# Patient Record
Sex: Female | Born: 1965 | Race: Black or African American | Hispanic: No | Marital: Single | Smoking: Never smoker
Health system: Southern US, Community
[De-identification: ages and names within clinical notes are randomized; demographics above are authoritative.]

## PROBLEM LIST (undated history)

## (undated) DIAGNOSIS — I10 Essential (primary) hypertension: Secondary | ICD-10-CM

## (undated) HISTORY — PX: ANKLE FRACTURE SURGERY: SHX122

---

## 2018-09-03 ENCOUNTER — Encounter (HOSPITAL_BASED_OUTPATIENT_CLINIC_OR_DEPARTMENT_OTHER): Payer: Self-pay | Admitting: Emergency Medicine

## 2018-09-03 ENCOUNTER — Emergency Department (HOSPITAL_BASED_OUTPATIENT_CLINIC_OR_DEPARTMENT_OTHER)
Admission: EM | Admit: 2018-09-03 | Discharge: 2018-09-03 | Disposition: A | Discharge status: Home / Self Care | Payer: BC Managed Care – PPO | Attending: Emergency Medicine | Admitting: Emergency Medicine

## 2018-09-03 ENCOUNTER — Other Ambulatory Visit: Payer: Self-pay

## 2018-09-03 DIAGNOSIS — I10 Essential (primary) hypertension: Secondary | ICD-10-CM | POA: Diagnosis not present

## 2018-09-03 DIAGNOSIS — M25572 Pain in left ankle and joints of left foot: Secondary | ICD-10-CM | POA: Diagnosis not present

## 2018-09-03 HISTORY — DX: Essential (primary) hypertension: I10

## 2018-09-03 MED ORDER — NAPROXEN 500 MG PO TABS: 500.0000 mg | ORAL_TABLET | Freq: Two times a day (BID) | ORAL | 0 refills | Status: AC

## 2018-09-03 NOTE — ED Triage Notes (Signed)
L ankle pain and swelling x 3 days. Denies injury.

## 2018-09-03 NOTE — Discharge Instructions (Signed)
You were evaluated in the Emergency Department and after careful evaluation, we did not find any emergent condition requiring admission or further testing in the hospital.  Your symptoms today seem to be due to inflammation of the ankle.  Please use the crutches and Ace wrap as directed and stay off of the ankle for the next several days to allow to heal.  You can use the anti-inflammatory medicine provided as needed.  Please return to the Emergency Department if you experience any worsening of your condition.  We encourage you to follow up with a primary care provider.  Thank you for allowing [place] to be a part of your care.

## 2018-09-03 NOTE — ED Provider Notes (Signed)
[facility] Emergency Department Provider Note MRN:  [id number]  Arrival date & time: 09/03/18     Chief Complaint   Ankle Pain   History of Present Illness   Jade Mcclure is a 53 y.o. year-old female with a history of hypertension presenting to the ED with chief complaint of ankle pain.  1 to 2 days of increased left ankle pain, worse with palpation or motion, moderate in severity.  Explains that prior to the onset of the pain she has been walking much more than normal.  Does not recall any specific episode of trauma.  Denies fever.  Review of Systems  A problem-focused ROS was performed. Positive for ankle pain.  Patient denies trauma.  Patient's Health History    Past Medical History:  Diagnosis Date  . Hypertension     History reviewed. No pertinent surgical history.  No family history on file.  Social History   Socioeconomic History  . Marital status: Single    Spouse name: Not on file  . Number of children: Not on file  . Years of education: Not on file  . Highest education level: Not on file  Occupational History  . Not on file  Social Needs  . Financial resource strain: Not on file  . Food insecurity    Worry: Not on file    Inability: Not on file  . Transportation needs    Medical: Not on file    Non-medical: Not on file  Tobacco Use  . Smoking status: Never Smoker  . Smokeless tobacco: Never Used  Substance and Sexual Activity  . Alcohol use: Not Currently  . Drug use: Not on file  . Sexual activity: Not on file  Lifestyle  . Physical activity    Days per week: Not on file    Minutes per session: Not on file  . Stress: Not on file  Relationships  . Social Herbalist on phone: Not on file    Gets together: Not on file    Attends religious service: Not on file    Active member of club or organization: Not on file    Attends meetings of clubs or organizations: Not on file    Relationship status: Not on file  .  Intimate partner violence    Fear of current or ex partner: Not on file    Emotionally abused: Not on file    Physically abused: Not on file    Forced sexual activity: Not on file  Other Topics Concern  . Not on file  Social History Narrative  . Not on file     Physical Exam  Vital Signs and Nursing Notes reviewed Vitals:   09/03/18 1043  BP: (!) 169/111  Pulse: 71  Resp: 18  Temp: 98.6 F (37 C)  SpO2: 100%    CONSTITUTIONAL: Well-appearing, NAD NEURO:  Alert and oriented x 3, no focal deficits EYES:  eyes equal and reactive ENT/NECK:  no LAD, no JVD CARDIO: Regular rate, well-perfused, normal S1 and S2 PULM:  CTAB no wheezing or rhonchi GI/GU:  normal bowel sounds, non-distended, non-tender MSK/SPINE:  No gross deformities, mild edema to the left ankle at the lateral malleolus, intact range of motion of the ankle, no increased warmth or erythema SKIN:  no rash, atraumatic PSYCH:  Appropriate speech and behavior  Diagnostic and Interventional Summary    Labs Reviewed - No data to display  No orders to display    Medications -  No data to display   Procedures Critical Care  ED Course and Medical Decision Making  I have reviewed the triage vital signs and the nursing notes.  Pertinent labs & imaging results that were available during my care of the patient were reviewed by me and considered in my medical decision making (see below for details).  Suspect sprain or inflammation related to overuse, no trauma to warrant imaging, no increased warmth or erythema, nothing to suggest septic joint, appropriate for discharge with conservative management, immobilization, NSAID use.  After the discussed management above, the patient was determined to be safe for discharge.  The patient was in agreement with this plan and all questions regarding their care were answered.  ED return precautions were discussed and the patient will return to the ED with any significant worsening of  condition.  Elmer SowMichael M. Pilar PlateBero, MD [facility] [email or web address]  Final Clinical Impressions(s) / ED Diagnoses     ICD-10-CM   1. Acute left ankle pain  M25.572     ED Discharge Orders         Ordered    naproxen (NAPROSYN) 500 MG tablet  2 times daily     09/03/18 1143             Sabas SousBero, Maleeya Peterkin M, MD 09/03/18 1146

## 2018-09-05 ENCOUNTER — Other Ambulatory Visit: Payer: Self-pay

## 2018-09-05 ENCOUNTER — Encounter (HOSPITAL_BASED_OUTPATIENT_CLINIC_OR_DEPARTMENT_OTHER): Payer: Self-pay

## 2018-09-05 ENCOUNTER — Emergency Department (HOSPITAL_BASED_OUTPATIENT_CLINIC_OR_DEPARTMENT_OTHER)
Admission: EM | Admit: 2018-09-05 | Discharge: 2018-09-05 | Disposition: A | Discharge status: Home / Self Care | Payer: BC Managed Care – PPO | Attending: Emergency Medicine | Admitting: Emergency Medicine

## 2018-09-05 ENCOUNTER — Emergency Department (HOSPITAL_BASED_OUTPATIENT_CLINIC_OR_DEPARTMENT_OTHER): Payer: BC Managed Care – PPO

## 2018-09-05 DIAGNOSIS — I10 Essential (primary) hypertension: Secondary | ICD-10-CM | POA: Diagnosis not present

## 2018-09-05 DIAGNOSIS — M25572 Pain in left ankle and joints of left foot: Secondary | ICD-10-CM | POA: Insufficient documentation

## 2018-09-05 NOTE — ED Triage Notes (Addendum)
Pt c/o con't pain to left ankle-seen here for same 7/18-states she needs to be out of work longer and needs RTW note-NAD-to triage in w/c-crutches in hand

## 2018-09-05 NOTE — ED Provider Notes (Signed)
[facility] EMERGENCY DEPARTMENT Provider Note   CSN: [id number] Arrival date & time: 09/05/18  1635    History   Chief Complaint Chief Complaint  Patient presents with  . Ankle Pain    HPI Jade Mcclure is a 53 y.o. female with a past medical history of hypertension who presents today for evaluation of left ankle pain.  She was seen on 7/18 for the same ankle pain and states that she got a note saying that she could return with light duty, however her work does not have light duty and she is requesting a note saying that she can go back on Monday.  She denies any worsening pain.  She denies any other changes since her previous evaluation.  She states that she has been elevating it and using the crutches to stay off of it and taking the medicine that she was prescribed.  She continues to deny any specific injury.  No fevers.     HPI  Past Medical History:  Diagnosis Date  . Hypertension     There are no active problems to display for this patient.   History reviewed. No pertinent surgical history.   OB History   No obstetric history on file.      Home Medications    Prior to Admission medications   Medication Sig Start Date End Date Taking? Authorizing Provider  naproxen (NAPROSYN) 500 MG tablet Take 1 tablet (500 mg total) by mouth 2 (two) times daily. 09/03/18  Yes Sabas SousBero, Michael M, MD    Family History No family history on file.  Social History Social History   Tobacco Use  . Smoking status: Never Smoker  . Smokeless tobacco: Never Used  Substance Use Topics  . Alcohol use: Not on file  . Drug use: Not on file     Allergies   Lisinopril   Review of Systems Review of Systems  Constitutional: Negative for chills and fever.  Musculoskeletal: Positive for joint swelling (Left ankle). Negative for back pain and neck pain.  All other systems reviewed and are negative.    Physical Exam Updated Vital Signs BP 139/90 (BP Location: Left Arm)    Pulse 77   Temp 98.3 F (36.8 C) (Oral)   Resp 18   LMP 08/17/2018   SpO2 100%   Physical Exam Vitals signs and nursing note reviewed.  Constitutional:      General: She is not in acute distress.    Appearance: She is not ill-appearing.  HENT:     Head: Normocephalic.  Cardiovascular:     Rate and Rhythm: Normal rate.  Pulmonary:     Effort: Pulmonary effort is normal. No respiratory distress.  Musculoskeletal:     Comments: Left lower extremity: She is able to dorsiflex and plantar flex her ankle while nonweightbearing without difficulty.  She has tenderness to palpation and edema along the lateral malleolus without calf swelling or proximal lower leg/knee tenderness.    Skin:    Comments: There is no abnormal redness, warmth, or induration on the left ankle.  Neurological:     Mental Status: She is alert.     Comments: Sensation intact to left foot/ankle.      ED Treatments / Results  Labs (all labs ordered are listed, but only abnormal results are displayed) Labs Reviewed - No data to display  EKG None  Radiology Dg Ankle Complete Left  Result Date: 09/05/2018 CLINICAL DATA:  Left ankle pain and swelling EXAM: LEFT ANKLE COMPLETE -  3+ VIEW COMPARISON:  None. FINDINGS: No acute fracture or malalignment. Ankle mortise is congruent. Mild degenerative spurring within the medial gutter. Bidirectional calcaneal enthesophytes. Mild enthesopathic changes at the fifth metatarsal base. Mild to moderate soft tissue swelling overlies the lateral malleolus. IMPRESSION: Soft tissue swelling without acute osseous abnormality. Electronically Signed   By: Davina Poke M.D.   On: 09/05/2018 17:29    Procedures Procedures (including critical care time)  Medications Ordered in ED Medications - No data to display   Initial Impression / Assessment and Plan / ED Course  I have reviewed the triage vital signs and the nursing notes.  Pertinent labs & imaging results that were  available during my care of the patient were reviewed by me and considered in my medical decision making (see chart for details).       Patient presents today for continued swelling and pain in her left ankle.  She was initially seen for this 2 days ago, however her work told her that they did not have light duty and she is requesting a work note for longer off.  She reports that she is unable to do her work secondary to the pain and swelling in her ankle and she is unable to use crutches while performing her work.  X-rays were obtained without evidence of acute osseous abnormality.  No evidence of septic arthritis or gout as it is not red, hot, or warm.  Do not suspect DVT given that she does not have generalized calf swelling or pain.  She is given additional work note.  Instructed on strict elevation above her heart and general rice.  She is given an Ace wrap.   Return precautions were discussed with patient who states their understanding.  At the time of discharge patient denied any unaddressed complaints or concerns.  Patient is agreeable for discharge home.   Final Clinical Impressions(s) / ED Diagnoses   Final diagnoses:  Acute left ankle pain    ED Discharge Orders    None       Ollen Gross 09/05/18 2222    Malvin Johns, MD 09/05/18 2245

## 2018-09-05 NOTE — ED Notes (Signed)
Patient transported to X-ray 

## 2020-05-03 IMAGING — DX LEFT ANKLE COMPLETE - 3+ VIEW
3 series · 4 of 4 positions shown · non-contrast
Comparison: None.

CLINICAL DATA: Left ankle pain and swelling

EXAM:
LEFT ANKLE COMPLETE - 3+ VIEW

[ankle ap]
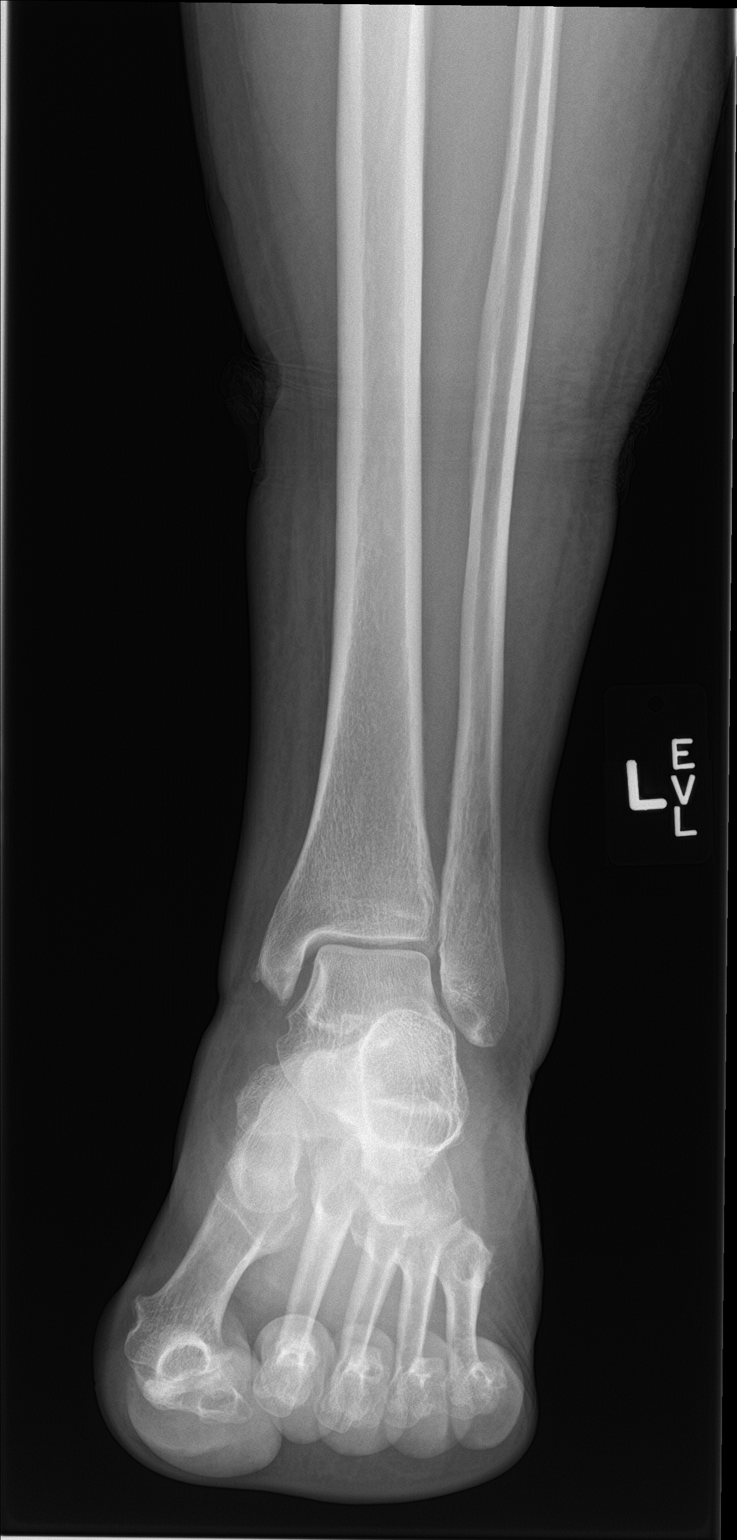

[Series 2: ankle obl · 0.14mm/px · 2 of 2 slices shown]
[im 1/2]
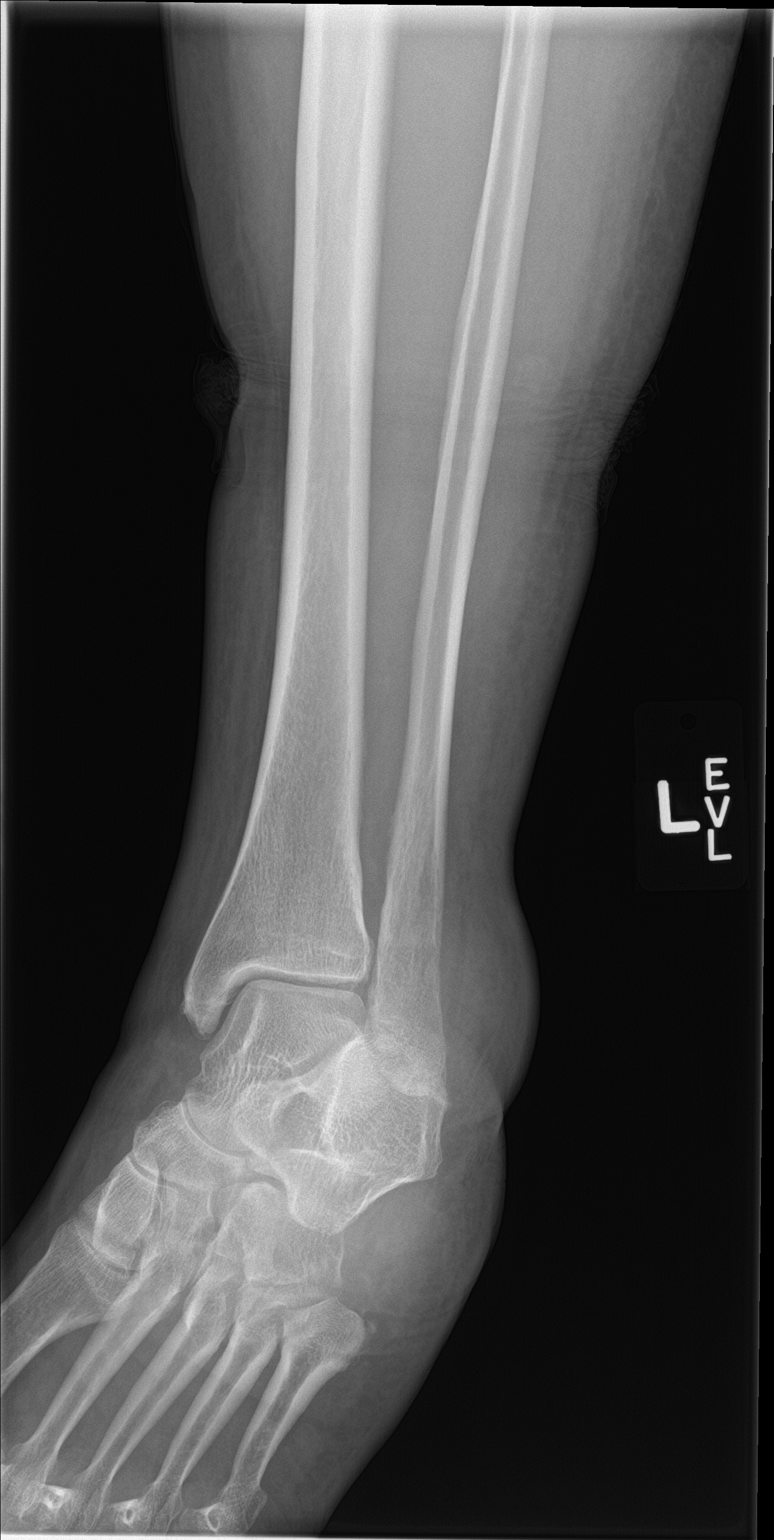
[im 2/2]
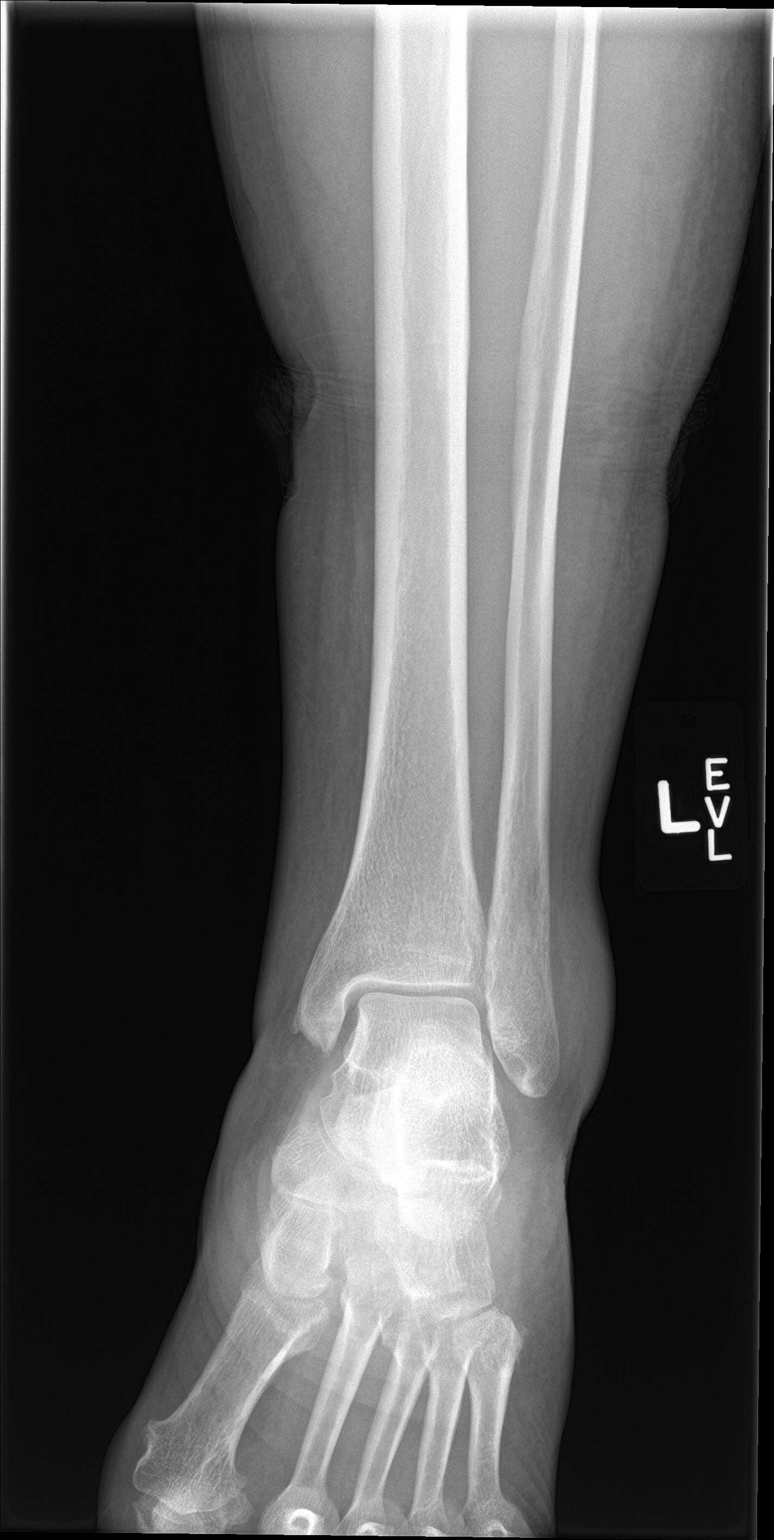

[ankle lat]
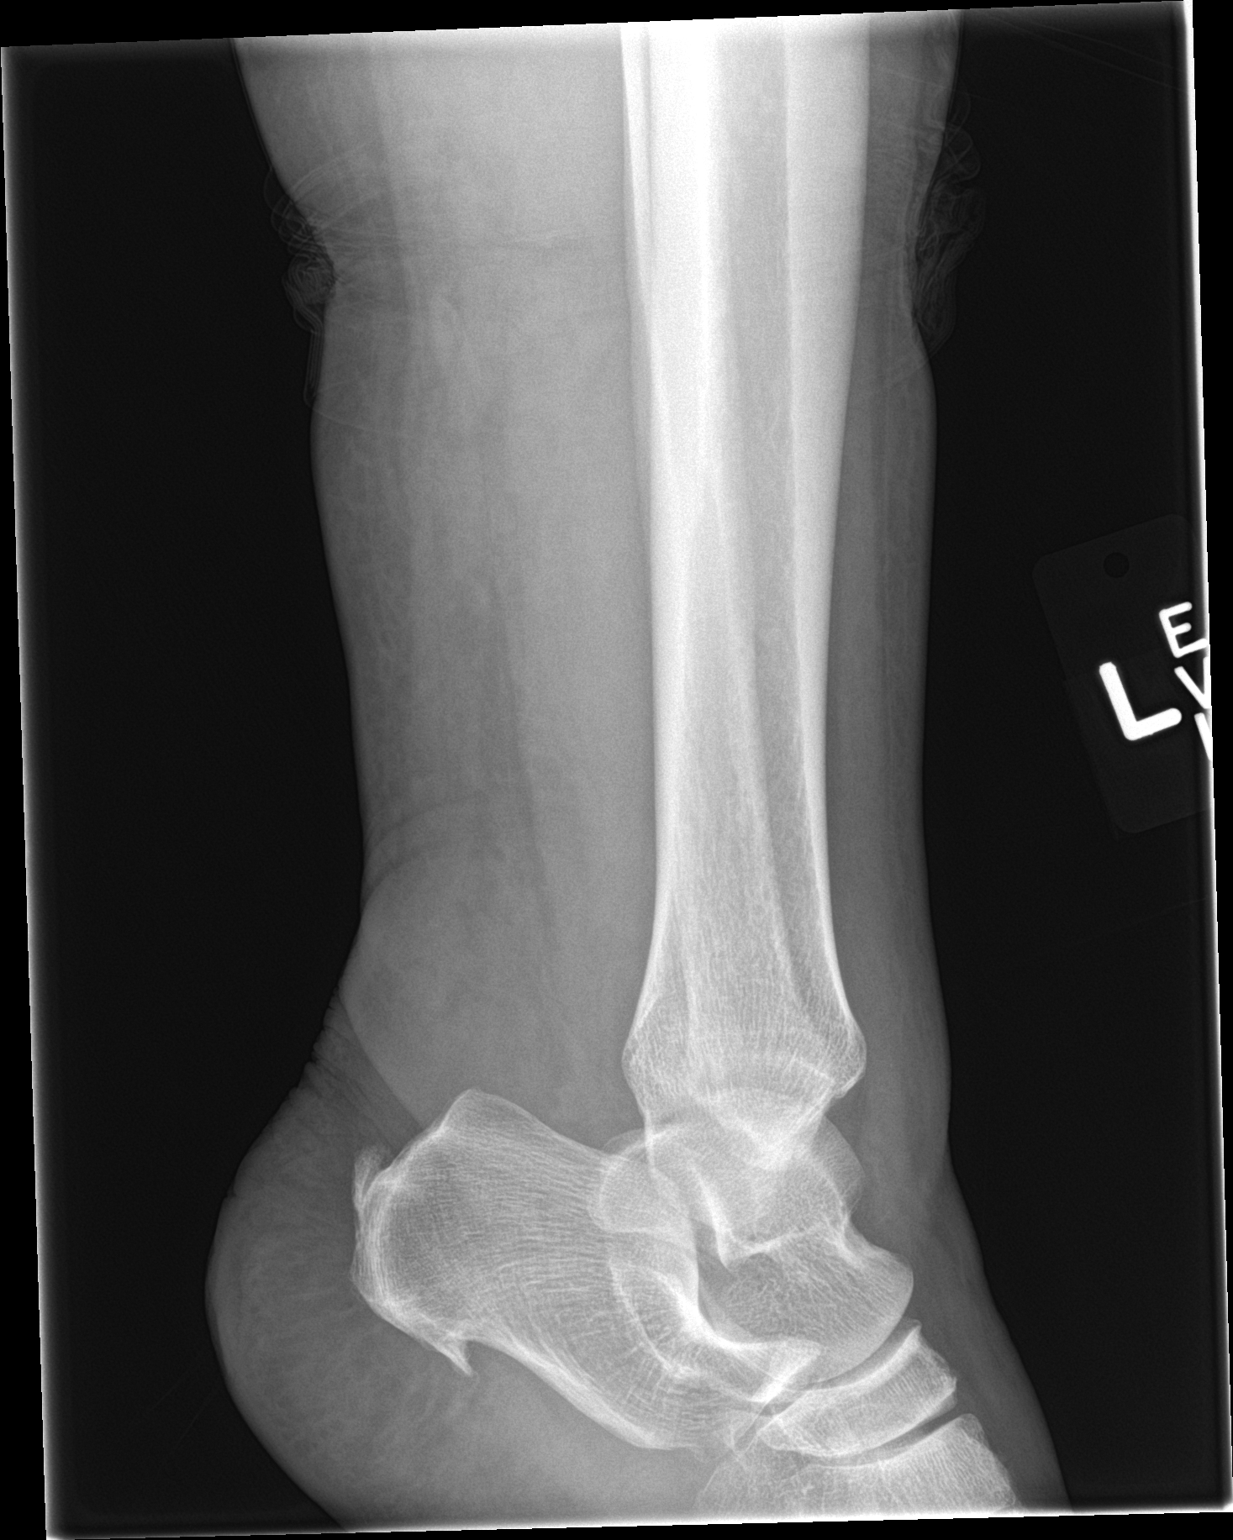

[4 of 4 positions shown; findings below may reference images not displayed]

FINDINGS: No acute fracture or malalignment. Ankle mortise is congruent. Mild
degenerative spurring within the medial gutter. Bidirectional
calcaneal enthesophytes. Mild enthesopathic changes at the fifth
metatarsal base. Mild to moderate soft tissue swelling overlies the
lateral malleolus.
IMPRESSION: Soft tissue swelling without acute osseous abnormality.

## 2020-06-13 ENCOUNTER — Other Ambulatory Visit: Payer: Self-pay

## 2020-06-13 ENCOUNTER — Emergency Department (HOSPITAL_BASED_OUTPATIENT_CLINIC_OR_DEPARTMENT_OTHER)
Admission: EM | Admit: 2020-06-13 | Discharge: 2020-06-13 | Disposition: A | Discharge status: Home / Self Care | Payer: BC Managed Care – PPO | Attending: Emergency Medicine | Admitting: Emergency Medicine

## 2020-06-13 ENCOUNTER — Encounter (HOSPITAL_BASED_OUTPATIENT_CLINIC_OR_DEPARTMENT_OTHER): Payer: Self-pay

## 2020-06-13 DIAGNOSIS — H02841 Edema of right upper eyelid: Secondary | ICD-10-CM | POA: Diagnosis present

## 2020-06-13 DIAGNOSIS — I10 Essential (primary) hypertension: Secondary | ICD-10-CM | POA: Diagnosis not present

## 2020-06-13 DIAGNOSIS — H00011 Hordeolum externum right upper eyelid: Secondary | ICD-10-CM | POA: Insufficient documentation

## 2020-06-13 NOTE — ED Provider Notes (Signed)
[facility] EMERGENCY DEPARTMENT Provider Note   CSN: [id number] Arrival date & time: 06/13/20  3557     History Chief Complaint  Patient presents with  . Eye Problem    Jade Mcclure is a 55 y.o. female with PMhx HTN who presents to the ED today with complaint of right upper eyelid swelling that she noticed this morning upon waking up.  Patient reports when she woke up this morning she looked in the mirror and noticed that her right upper eyelid was swollen.  She denies any crusting/matting shut of the eye.  She went to work this morning and they told her to come to the ED for further evaluation.  She denies any pain.  She does report that her upper eyelid feels tight.  She denies any specific eye pain, redness, drainage, blurry vision, double vision.  She wears glasses.  Does not wear contacts.  No other complaints at this time.  The history is provided by the patient and medical records.       Past Medical History:  Diagnosis Date  . Hypertension     There are no problems to display for this patient.   History reviewed. No pertinent surgical history.   OB History   No obstetric history on file.     History reviewed. No pertinent family history.  Social History   Tobacco Use  . Smoking status: Never Smoker  . Smokeless tobacco: Never Used    Home Medications Prior to Admission medications   Medication Sig Start Date End Date Taking? Authorizing Provider  naproxen (NAPROSYN) 500 MG tablet Take 1 tablet (500 mg total) by mouth 2 (two) times daily. 09/03/18   Sabas Sous, MD    Allergies    Lisinopril  Review of Systems   Review of Systems  Constitutional: Negative for chills and fever.  Eyes: Negative for pain, discharge and redness.       + right upper eyelid swelling  All other systems reviewed and are negative.   Physical Exam Updated Vital Signs BP (!) 160/116   Pulse 74   Temp 98.3 F (36.8 C) (Oral)   Resp 16   Ht 5\' 4"  (1.626 m)    Wt 89.8 kg   LMP 05/07/2020   SpO2 99%   BMI 33.99 kg/m   Physical Exam Vitals and nursing note reviewed.  Constitutional:      Appearance: She is not ill-appearing.  HENT:     Head: Normocephalic and atraumatic.  Eyes:     Extraocular Movements: Extraocular movements intact.     Conjunctiva/sclera: Conjunctivae normal.     Pupils: Pupils are equal, round, and reactive to light.     Comments: Mild swelling noted to upper right eyelid with mild TTP consistent with a stye. PERRL. Conjunctiva clear and EOMI.   Cardiovascular:     Rate and Rhythm: Normal rate and regular rhythm.  Pulmonary:     Effort: Pulmonary effort is normal.     Breath sounds: Normal breath sounds. No wheezing, rhonchi or rales.  Skin:    General: Skin is warm and dry.     Coloration: Skin is not jaundiced.  Neurological:     Mental Status: She is alert.     ED Results / Procedures / Treatments   Labs (all labs ordered are listed, but only abnormal results are displayed) Labs Reviewed - No data to display  EKG None  Radiology No results found.  Procedures Procedures   Medications Ordered  in ED Medications - No data to display  ED Course  I have reviewed the triage vital signs and the nursing notes.  Pertinent labs & imaging results that were available during my care of the patient were reviewed by me and considered in my medical decision making (see chart for details).    MDM Rules/Calculators/A&P                          55 year old female presents to the ED today with complaint of right upper eyelid swelling that began this morning.  Denies any crusting shut of the eye.  No fevers or chills, eye pain, drainage.  On arrival to the ED vitals are stable.  Patient appears to be in no acute distress.  Her exam is consistent with a hordeolum at this time.  Her conjunctive is clear, no signs consistent with bacterial or viral conjunctivitis.  She denies any pain to the right eye, foreign body  sensation, vision changes.  I do not feel patient requires further work-up at this time as I have low suspicion for corneal abrasion, conjunctivitis, or other eye problem today. Have instructed on warm compresses today as well as avoidance of make-up or other hygiene products on the eyelid.  She is instructed to continue wearing her glasses at this time and to avoid putting contacts in her eye.  She is in agreement with plan.  Advised to follow-up with her PCP for same and to return to the ED for any worsening symptoms.  Stable for discharge.  This note was prepared using Dragon voice recognition software and may include unintentional dictation errors due to the inherent limitations of voice recognition software.  Final Clinical Impression(s) / ED Diagnoses Final diagnoses:  Hordeolum externum of right upper eyelid    Rx / DC Orders ED Discharge Orders    None       Discharge Instructions     Please apply warm compresses to the eye to help with swelling. If you begin having some pain to the upper eyelid you can take Ibuprofen or Tylenol as needed. Attached is additional information on styes which is likely the cause of your symptoms today.   Please follow up with your PCP regarding your ED visit today  Return to the ED for any worsening symptoms including crusting/matting of eye in the mornings, drainage from eye, redness to the eye itself, pain to the eye, or any other new/concerning symptoms       Tanda Rockers, PA-C 06/13/20 1100    Tegeler, Canary Brim, MD 06/13/20 [id number]

## 2020-06-13 NOTE — ED Triage Notes (Signed)
Pt reports right eye lid swelling. 5/10 pain. States she just woke up this morning and it was swollen. Denies any trauma to eye.

## 2020-06-13 NOTE — Discharge Instructions (Addendum)
Please apply warm compresses to the eye to help with swelling. If you begin having some pain to the upper eyelid you can take Ibuprofen or Tylenol as needed. Attached is additional information on styes which is likely the cause of your symptoms today.   Please follow up with your PCP regarding your ED visit today  Return to the ED for any worsening symptoms including crusting/matting of eye in the mornings, drainage from eye, redness to the eye itself, pain to the eye, or any other new/concerning symptoms

## 2020-10-10 ENCOUNTER — Other Ambulatory Visit: Payer: Self-pay

## 2020-10-10 ENCOUNTER — Emergency Department (HOSPITAL_BASED_OUTPATIENT_CLINIC_OR_DEPARTMENT_OTHER)
Admission: EM | Admit: 2020-10-10 | Discharge: 2020-10-10 | Disposition: A | Discharge status: Home / Self Care | Payer: BC Managed Care – PPO | Attending: Emergency Medicine | Admitting: Emergency Medicine

## 2020-10-10 ENCOUNTER — Encounter (HOSPITAL_BASED_OUTPATIENT_CLINIC_OR_DEPARTMENT_OTHER): Payer: Self-pay

## 2020-10-10 DIAGNOSIS — R0602 Shortness of breath: Secondary | ICD-10-CM | POA: Insufficient documentation

## 2020-10-10 DIAGNOSIS — R1110 Vomiting, unspecified: Secondary | ICD-10-CM | POA: Insufficient documentation

## 2020-10-10 DIAGNOSIS — E871 Hypo-osmolality and hyponatremia: Secondary | ICD-10-CM | POA: Insufficient documentation

## 2020-10-10 DIAGNOSIS — Z8616 Personal history of COVID-19: Secondary | ICD-10-CM | POA: Insufficient documentation

## 2020-10-10 DIAGNOSIS — I10 Essential (primary) hypertension: Secondary | ICD-10-CM | POA: Insufficient documentation

## 2020-10-10 DIAGNOSIS — R5383 Other fatigue: Secondary | ICD-10-CM | POA: Insufficient documentation

## 2020-10-10 LAB — COMPREHENSIVE METABOLIC PANEL
ALT: 63 U/L — ABNORMAL HIGH (ref 0–44)
AST: 75 U/L — ABNORMAL HIGH (ref 15–41)
Albumin: 3.9 g/dL (ref 3.5–5.0)
Alkaline Phosphatase: 93 U/L (ref 38–126)
Anion gap: 11 (ref 5–15)
BUN: 9 mg/dL (ref 6–20)
CO2: 26 mmol/L (ref 22–32)
Calcium: 9.3 mg/dL (ref 8.9–10.3)
Chloride: 100 mmol/L (ref 98–111)
Creatinine, Ser: 0.98 mg/dL (ref 0.44–1.00)
GFR, Estimated: >60 mL/min (ref >60)
Glucose, Bld: 82 mg/dL (ref 70–99)
Potassium: 3.7 mmol/L (ref 3.5–5.1)
Sodium: 137 mmol/L (ref 135–145)
Total Bilirubin: 0.7 mg/dL (ref 0.3–1.2)
Total Protein: 8.2 g/dL — ABNORMAL HIGH (ref 6.5–8.1)

## 2020-10-10 LAB — URINALYSIS, ROUTINE W REFLEX MICROSCOPIC
Bilirubin Urine: NEGATIVE
Glucose, UA: NEGATIVE mg/dL
Hgb urine dipstick: NEGATIVE
Ketones, ur: 5 mg/dL — AB
Leukocytes,Ua: NEGATIVE
Nitrite: NEGATIVE
Protein, ur: NEGATIVE mg/dL
Specific Gravity, Urine: 1.03 (ref 1.005–1.030)
pH: 6 (ref 5.0–8.0)

## 2020-10-10 LAB — CBC WITH DIFFERENTIAL/PLATELET
Abs Immature Granulocytes: 0.02 10*3/uL (ref 0.00–0.07)
Basophils Absolute: 0 10*3/uL (ref 0.0–0.1)
Basophils Relative: 1 %
Eosinophils Absolute: 0.1 10*3/uL (ref 0.0–0.5)
Eosinophils Relative: 2 %
HCT: 41.7 % (ref 36.0–46.0)
Hemoglobin: 14.2 g/dL (ref 12.0–15.0)
Immature Granulocytes: 0 %
Lymphocytes Relative: 30 %
Lymphs Abs: 1.6 10*3/uL (ref 0.7–4.0)
MCH: 32.6 pg (ref 26.0–34.0)
MCHC: 34.1 g/dL (ref 30.0–36.0)
MCV: 95.6 fL (ref 80.0–100.0)
Monocytes Absolute: 0.7 10*3/uL (ref 0.1–1.0)
Monocytes Relative: 12 %
Neutro Abs: 2.9 10*3/uL (ref 1.7–7.7)
Neutrophils Relative %: 55 %
Platelets: 227 10*3/uL (ref 150–400)
RBC: 4.36 MIL/uL (ref 3.87–5.11)
RDW: 13.3 % (ref 11.5–15.5)
WBC: 5.3 10*3/uL (ref 4.0–10.5)
nRBC: 0 % (ref 0.0–0.2)

## 2020-10-10 LAB — TSH: TSH: 3.499 u[IU]/mL (ref 0.350–4.500)

## 2020-10-10 LAB — TROPONIN I (HIGH SENSITIVITY): Troponin I (High Sensitivity): 14 ng/L (ref <18)

## 2020-10-10 LAB — BRAIN NATRIURETIC PEPTIDE: B Natriuretic Peptide: 13.1 pg/mL (ref 0.0–100.0)

## 2020-10-10 LAB — T4, FREE: Free T4: 0.85 ng/dL (ref 0.61–1.12)

## 2020-10-10 NOTE — ED Provider Notes (Signed)
[facility] EMERGENCY DEPARTMENT Provider Note   CSN: [id number] Arrival date & time: 10/10/20  0801     History Chief Complaint  Patient presents with   Hypertension    Jade Mcclure is a 55 y.o. female with a history of HTN presenting to the ED with complaint of high blood pressure and feeling poorly.  She states she woke up feeling "fatigued" and "like my eyes were aching."  She works in a [employer] and went in per normal, but felt too fatigued to finish her job.  Her coworkers advised she come to the ED.  She denies CP, shortness of breath, leg swelling, headache, fevers, chills, cough, sore throat.  Denies recent illness or sick contacts at home.  She had Covid-19 in June 2022, briefly ill with vomiting, but felt she had recovered since then.  Patient reports compliance with home BP meds, which are amlodipine and HCTZ, and took them this morning.   She noted her BP was high today, cannot recall the number for me.    She reports she does not smoke, drinks etoh lightly, and does not use any other recreational drugs.  From medical record review, patient was admitted for hypertension with mildly elevated hsTrop in Nov 2021 at [facility], started on amlodipine in addition to her HCTZ medication.  She had a normal stress test in the hospital on 12/25/19 and her trops were flat.  Echo TTE on 12/26/19 showing EF 65-70%, mild LVH, mild mitral regurg, mild dilation of left atrium.  Lab blood tests on 08/08/20 also showing hyponatremia Na 129, Cl 97, Cr normal 0.85, AST 118, ALT 79.  HPI     Past Medical History:  Diagnosis Date   Hypertension     There are no problems to display for this patient.   History reviewed. No pertinent surgical history.   OB History   No obstetric history on file.     No family history on file.  Social History   Tobacco Use   Smoking status: Never   Smokeless tobacco: Never  Substance Use Topics   Alcohol use: Never    Drug use: Never    Home Medications Prior to Admission medications   Medication Sig Start Date End Date Taking? Authorizing Provider  naproxen (NAPROSYN) 500 MG tablet Take 1 tablet (500 mg total) by mouth 2 (two) times daily. 09/03/18   Sabas Sous, MD    Allergies    Lisinopril  Review of Systems   Review of Systems  Constitutional:  Positive for fatigue. Negative for chills and fever.  HENT:  Negative for ear pain and sore throat.   Eyes:  Positive for pain. Negative for photophobia, discharge, itching and visual disturbance.  Respiratory:  Negative for cough and shortness of breath.   Cardiovascular:  Negative for chest pain and palpitations.  Gastrointestinal:  Negative for abdominal pain, constipation, diarrhea, nausea and vomiting.  Genitourinary:  Negative for dysuria and hematuria.  Musculoskeletal:  Negative for arthralgias and back pain.  Skin:  Negative for color change and rash.  Neurological:  Negative for dizziness, seizures, syncope, facial asymmetry, weakness, light-headedness, numbness and headaches.  All other systems reviewed and are negative.  Physical Exam Updated Vital Signs BP (!) 165/93 Comment: adjusted cuff and remeasured  Pulse (!) 55   Temp 98.6 F (37 C) (Oral)   Resp 14   Ht 5\' 2"  (1.575 m)   Wt 80.3 kg   SpO2 98%   BMI 32.37  kg/m   Physical Exam Constitutional:      General: She is not in acute distress. HENT:     Head: Normocephalic and atraumatic.  Eyes:     General: No scleral icterus.       Right eye: No discharge.        Left eye: No discharge.     Extraocular Movements: Extraocular movements intact.     Conjunctiva/sclera: Conjunctivae normal.     Pupils: Pupils are equal, round, and reactive to light.  Cardiovascular:     Rate and Rhythm: Normal rate and regular rhythm.     Pulses: Normal pulses.  Pulmonary:     Effort: Pulmonary effort is normal. No respiratory distress.  Abdominal:     General: There is no  distension.     Tenderness: There is no abdominal tenderness. There is no guarding or rebound.  Musculoskeletal:     Comments: Mild symmetrical LE pitting edema  Skin:    General: Skin is warm and dry.  Neurological:     General: No focal deficit present.     Mental Status: She is alert and oriented to person, place, and time. Mental status is at baseline.     Sensory: No sensory deficit.     Motor: No weakness.  Psychiatric:        Mood and Affect: Mood normal.        Behavior: Behavior normal.    ED Results / Procedures / Treatments   Labs (all labs ordered are listed, but only abnormal results are displayed) Labs Reviewed  COMPREHENSIVE METABOLIC PANEL - Abnormal; Notable for the following components:      Result Value   Total Protein 8.2 (*)    AST 75 (*)    ALT 63 (*)    All other components within normal limits  URINALYSIS, ROUTINE W REFLEX MICROSCOPIC - Abnormal; Notable for the following components:   APPearance HAZY (*)    Ketones, ur 5 (*)    All other components within normal limits  CBC WITH DIFFERENTIAL/PLATELET  TSH  T4, FREE  BRAIN NATRIURETIC PEPTIDE  TROPONIN I (HIGH SENSITIVITY)  TROPONIN I (HIGH SENSITIVITY)    EKG None  Radiology No results found.  Procedures Procedures   Medications Ordered in ED Medications - No data to display  ED Course  I have reviewed the triage vital signs and the nursing notes.  Pertinent labs & imaging results that were available during my care of the patient were reviewed by me and considered in my medical decision making (see chart for details).  Fatigue since this morning  Ddx includes electrolyte imbalance (including hyponatremia) vs viral illness vs anemia vs UTI vs atypical ACS vs other  BP 140's/100 in the room, equal on bilateral arms. No chest pain or pressure to suggest ACS/aortic dissection, but with her hx of troponin elevations last year, we'll recheck her hsTrop today. ECG reviewed personally  showing NSR, no acute ischemic findings  Unlikely to be Covid two months after a confirmed infection. She does not appear septic.  No localizing stroke symptoms or neuro deficits.  No headache.  Doubt MS.  Does not appear to be jaundiced.  Minor LFT disturbance in June; we'll recheck her numbers today.  No hx of HIV/hepatitis per her report.  I personally reviewed her prior medical records as noted above  Labs reviewed - thyroid studies pending, Trop 14, BNP 13, UA without sign of infection.  CMP unremarkable (Na and CL improved from prior  check and normal here, LFT's also stable and improved from prior).  CBC unremarkable.    I have a lower suspicion for ACS, PE, sepsis, pneumonia, or hypertensive emergency. Low suspicion for liver failure or biliary obstruction  Advised supportive care at home, f/u with PCP stressed for re-examination.  Advised doubling amlodipine dose from 5 to 10 mg and having PCP recheck BP.  She verbalized understanding.         Final Clinical Impression(s) / ED Diagnoses Final diagnoses:  Other fatigue    Rx / DC Orders ED Discharge Orders     None        Krisa Blattner, Kermit Balo, MD 10/10/20 1723

## 2020-10-10 NOTE — Discharge Instructions (Signed)
Please call to schedule follow-up appointment with your doctor this week if possible.  Your doctor can follow-up on your thyroid studies which were sent today.

## 2020-10-10 NOTE — ED Notes (Signed)
ED Provider at bedside. 

## 2020-10-10 NOTE — ED Triage Notes (Signed)
Pt arrives with c/o high BP, states that she is on mediation for HTN and has been taking it regularly. States that she was feeling bad at work which is why she had her BP check, BP was 172/105 HR 81.

## 2020-11-21 ENCOUNTER — Encounter (HOSPITAL_BASED_OUTPATIENT_CLINIC_OR_DEPARTMENT_OTHER): Payer: Self-pay | Admitting: Emergency Medicine

## 2020-11-21 ENCOUNTER — Emergency Department (HOSPITAL_BASED_OUTPATIENT_CLINIC_OR_DEPARTMENT_OTHER)
Admission: EM | Admit: 2020-11-21 | Discharge: 2020-11-21 | Disposition: A | Discharge status: Home / Self Care | Payer: Self-pay | Attending: Emergency Medicine | Admitting: Emergency Medicine

## 2020-11-21 ENCOUNTER — Other Ambulatory Visit: Payer: Self-pay

## 2020-11-21 DIAGNOSIS — S39012A Strain of muscle, fascia and tendon of lower back, initial encounter: Secondary | ICD-10-CM

## 2020-11-21 DIAGNOSIS — I10 Essential (primary) hypertension: Secondary | ICD-10-CM | POA: Insufficient documentation

## 2020-11-21 DIAGNOSIS — M549 Dorsalgia, unspecified: Secondary | ICD-10-CM | POA: Insufficient documentation

## 2020-11-21 MED ORDER — AMLODIPINE BESYLATE 10 MG PO TABS: 10.0000 mg | ORAL_TABLET | Freq: Every day | ORAL | 0 refills | Status: DC

## 2020-11-21 MED ORDER — HYDROCHLOROTHIAZIDE 25 MG PO TABS
50.0000 mg | ORAL_TABLET | Freq: Once | ORAL | Status: AC
  Administered 2020-11-21: 50 mg via ORAL
  Filled 2020-11-21: qty 2

## 2020-11-21 MED ORDER — HYDRALAZINE HCL 20 MG/ML IJ SOLN
5.0000 mg | Freq: Once | INTRAMUSCULAR | Status: AC
  Administered 2020-11-21: 5 mg via INTRAMUSCULAR
  Filled 2020-11-21: qty 1

## 2020-11-21 NOTE — ED Triage Notes (Signed)
Pt is c/o pain in her back on the left side  Pt states the pain is not constant but comes and goes  Pt states the pain is in the middle of her back on the left side   Denies injury

## 2020-11-21 NOTE — ED Provider Notes (Signed)
[facility] EMERGENCY DEPARTMENT Provider Note   CSN: [id number] Arrival date & time: 11/21/20  5277     History Chief Complaint  Patient presents with   Back Pain    Jade Mcclure is a 55 y.o. female.  The history is provided by the patient.  Back Pain Location:  Sacro-iliac joint Quality:  Stabbing Radiates to:  Does not radiate Pain severity:  No pain Pain is:  Same all the time Onset quality:  Sudden Duration:  1 hour Timing:  Constant Progression:  Resolved Chronicity:  New Context comment:  Rooled over in a lumpy old bed Relieved by:  OTC medications Worsened by:  Nothing Ineffective treatments:  None tried Associated symptoms: no abdominal pain, no abdominal swelling, no bladder incontinence, no bowel incontinence, no chest pain, no dysuria, no fever, no headaches, no leg pain, no numbness, no paresthesias, no pelvic pain, no perianal numbness, no tingling, no weakness and no weight loss   Risk factors: no hx of cancer   Rolled over in an old lumpy bed and had back pain.  Lasted seconds and is now gone.  She took tylenol 15 minutes ago.  No weakness no numbness.  No abdominal pain, no chest pain.  She hasn't taken her BP meds yet this morning.  No urinary symptoms.  She states she needs a new mattress.      Past Medical History:  Diagnosis Date   Hypertension     There are no problems to display for this patient.   Past Surgical History:  Procedure Laterality Date   ANKLE FRACTURE SURGERY       OB History   No obstetric history on file.     History reviewed. No pertinent family history.  Social History   Tobacco Use   Smoking status: Never   Smokeless tobacco: Never  Vaping Use   Vaping Use: Never used  Substance Use Topics   Alcohol use: Never   Drug use: Never    Home Medications Prior to Admission medications   Medication Sig Start Date End Date Taking? Authorizing Provider  naproxen (NAPROSYN) 500 MG tablet Take 1 tablet (500  mg total) by mouth 2 (two) times daily. 09/03/18   Sabas Sous, MD    Allergies    Lisinopril  Review of Systems   Review of Systems  Constitutional:  Negative for fever and weight loss.  HENT:  Negative for drooling.   Eyes:  Negative for redness.  Respiratory:  Negative for wheezing and stridor.   Cardiovascular:  Negative for chest pain.  Gastrointestinal:  Negative for abdominal pain, bowel incontinence and vomiting.  Genitourinary:  Negative for bladder incontinence, dysuria, hematuria and pelvic pain.  Musculoskeletal:  Positive for back pain. Negative for neck pain.  Skin:  Negative for rash.  Neurological:  Negative for tingling, weakness, numbness, headaches and paresthesias.  Psychiatric/Behavioral:  Negative for agitation.   All other systems reviewed and are negative.  Physical Exam Updated Vital Signs BP (!) 206/119 (BP Location: Right Arm)   Pulse 66   Temp 98 F (36.7 C) (Oral)   Resp 16   Ht 5\' 4"  (1.626 m)   Wt 80.7 kg   SpO2 100%   BMI 30.55 kg/m   Physical Exam Vitals and nursing note reviewed.  Constitutional:      General: She is not in acute distress.    Appearance: Normal appearance.  HENT:     Head: Normocephalic and atraumatic.     Nose:  Nose normal.  Eyes:     Conjunctiva/sclera: Conjunctivae normal.     Pupils: Pupils are equal, round, and reactive to light.  Cardiovascular:     Rate and Rhythm: Normal rate and regular rhythm.     Pulses: Normal pulses.     Heart sounds: Normal heart sounds.  Pulmonary:     Effort: Pulmonary effort is normal.     Breath sounds: Normal breath sounds.  Abdominal:     General: Abdomen is flat. Bowel sounds are normal.     Palpations: Abdomen is soft. There is no mass.     Tenderness: There is no abdominal tenderness. There is no guarding or rebound.  Musculoskeletal:        General: Normal range of motion.       Arms:     Cervical back: Normal, normal range of motion and neck supple.     Thoracic  back: Normal.     Lumbar back: Normal.     Comments: No paraspinal tenderness   Skin:    General: Skin is warm and dry.     Capillary Refill: Capillary refill takes less than 2 seconds.  Neurological:     General: No focal deficit present.     Mental Status: She is alert and oriented to person, place, and time.     Deep Tendon Reflexes: Reflexes normal.  Psychiatric:        Mood and Affect: Mood normal.        Behavior: Behavior normal.    ED Results / Procedures / Treatments   Labs (all labs ordered are listed, but only abnormal results are displayed) Labs Reviewed - No data to display  EKG None  Radiology No results found.  Procedures Procedures   Medications Ordered in ED Medications  hydrALAZINE (APRESOLINE) injection 5 mg (has no administration in time range)  hydrochlorothiazide (HYDRODIURIL) tablet 50 mg (has no administration in time range)    ED Course  I have reviewed the triage vital signs and the nursing notes.  Pertinent labs & imaging results that were available during my care of the patient were reviewed by me and considered in my medical decision making (see chart for details).   I do not believe patient is taking her BP medication.  She rolled out in bed and had a second of pain,  it is now gone and the tylenol hasn't even had time to be active.  I have given BP medication in the ED and am rechecking BP.  Take home BP medication this am.  Strict return precautions given.     Jade Mcclure was evaluated in Emergency Department on 11/21/2020 for the symptoms described in the history of present illness. She was evaluated in the context of the global COVID-19 pandemic, which necessitated consideration that the patient might be at risk for infection with the SARS-CoV-2 virus that causes COVID-19. Institutional protocols and algorithms that pertain to the evaluation of patients at risk for COVID-19 are in a state of rapid change based on information released by  regulatory bodies including the CDC and federal and state organizations. These policies and algorithms were followed during the patient's care in the ED.  Final Clinical Impression(s) / ED Diagnoses Final diagnoses:  None   Return for intractable cough, coughing up blood, fevers > 100.4 unrelieved by medication, shortness of breath, intractable vomiting, chest pain, shortness of breath, weakness, numbness, changes in speech, facial asymmetry, abdominal pain, passing out, Inability to tolerate liquids or food, cough,  altered mental status or any concerns. No signs of systemic illness or infection. The patient is nontoxic-appearing on exam and vital signs are within normal limits.  I have reviewed the triage vital signs and the nursing notes. Pertinent labs & imaging results that were available during my care of the patient were reviewed by me and considered in my medical decision making (see chart for details). After history, exam, and medical workup I feel the patient has been appropriately medically screened and is safe for discharge home. Pertinent diagnoses were discussed with the patient. Patient was given return precautions.  Rx / DC Orders ED Discharge Orders     None        Dyquan Minks, MD 11/21/20 1610

## 2021-02-07 ENCOUNTER — Other Ambulatory Visit: Payer: Self-pay

## 2021-02-07 ENCOUNTER — Encounter (HOSPITAL_BASED_OUTPATIENT_CLINIC_OR_DEPARTMENT_OTHER): Payer: Self-pay | Admitting: *Deleted

## 2021-02-07 ENCOUNTER — Emergency Department (HOSPITAL_BASED_OUTPATIENT_CLINIC_OR_DEPARTMENT_OTHER)
Admission: EM | Admit: 2021-02-07 | Discharge: 2021-02-07 | Disposition: A | Discharge status: Home / Self Care | Payer: Self-pay | Attending: Emergency Medicine | Admitting: Emergency Medicine

## 2021-02-07 DIAGNOSIS — I10 Essential (primary) hypertension: Secondary | ICD-10-CM

## 2021-02-07 DIAGNOSIS — Z79899 Other long term (current) drug therapy: Secondary | ICD-10-CM | POA: Insufficient documentation

## 2021-02-07 LAB — URINALYSIS, ROUTINE W REFLEX MICROSCOPIC
Bilirubin Urine: NEGATIVE
Glucose, UA: NEGATIVE mg/dL
Hgb urine dipstick: NEGATIVE
Ketones, ur: NEGATIVE mg/dL
Leukocytes,Ua: NEGATIVE
Nitrite: NEGATIVE
Protein, ur: NEGATIVE mg/dL
Specific Gravity, Urine: 1.015 (ref 1.005–1.030)
pH: 5.5 (ref 5.0–8.0)

## 2021-02-07 LAB — BASIC METABOLIC PANEL
Anion gap: 9 (ref 5–15)
BUN: 9 mg/dL (ref 6–20)
CO2: 24 mmol/L (ref 22–32)
Calcium: 9 mg/dL (ref 8.9–10.3)
Chloride: 99 mmol/L (ref 98–111)
Creatinine, Ser: 0.95 mg/dL (ref 0.44–1.00)
GFR, Estimated: >60 mL/min (ref >60)
Glucose, Bld: 79 mg/dL (ref 70–99)
Potassium: 4.1 mmol/L (ref 3.5–5.1)
Sodium: 132 mmol/L — ABNORMAL LOW (ref 135–145)

## 2021-02-07 LAB — CBC WITH DIFFERENTIAL/PLATELET
Abs Immature Granulocytes: 0.01 10*3/uL (ref 0.00–0.07)
Basophils Absolute: 0.1 10*3/uL (ref 0.0–0.1)
Basophils Relative: 1 %
Eosinophils Absolute: 0.1 10*3/uL (ref 0.0–0.5)
Eosinophils Relative: 3 %
HCT: 43 % (ref 36.0–46.0)
Hemoglobin: 14.5 g/dL (ref 12.0–15.0)
Immature Granulocytes: 0 %
Lymphocytes Relative: 32 %
Lymphs Abs: 1.5 10*3/uL (ref 0.7–4.0)
MCH: 31.9 pg (ref 26.0–34.0)
MCHC: 33.7 g/dL (ref 30.0–36.0)
MCV: 94.7 fL (ref 80.0–100.0)
Monocytes Absolute: 0.6 10*3/uL (ref 0.1–1.0)
Monocytes Relative: 13 %
Neutro Abs: 2.3 10*3/uL (ref 1.7–7.7)
Neutrophils Relative %: 51 %
Platelets: 189 10*3/uL (ref 150–400)
RBC: 4.54 MIL/uL (ref 3.87–5.11)
RDW: 13 % (ref 11.5–15.5)
WBC: 4.6 10*3/uL (ref 4.0–10.5)
nRBC: 0 % (ref 0.0–0.2)

## 2021-02-07 MED ORDER — AMLODIPINE BESYLATE 10 MG PO TABS: 10.0000 mg | ORAL_TABLET | Freq: Every day | ORAL | 0 refills | Status: DC

## 2021-02-07 NOTE — ED Provider Notes (Signed)
[place] EMERGENCY DEPARTMENT Provider Note   CSN: [id number] Arrival date & time: 02/07/21  1111     History Chief Complaint  Patient presents with   Hypertension    Jade Mcclure is a 55 y.o. female with history of hypertension.  Patient states that she "got up and felt dizzy" this morning and felt like her blood pressure was high.  Patient states that she has been compliant on prescription amlodipine for blood pressure control.  Patient also includes that she has had a recent death in her family, her mother recently passed and she is extremely stressed about this.  Patient endorses a "light headache ", "slight blurred vision", dark urine.  Patient denies chest pain, shortness of breath, nausea, vomiting, diarrhea.   Hypertension Associated symptoms include headaches. Pertinent negatives include no chest pain, no abdominal pain and no shortness of breath.      Past Medical History:  Diagnosis Date   Hypertension     There are no problems to display for this patient.   Past Surgical History:  Procedure Laterality Date   ANKLE FRACTURE SURGERY       OB History   No obstetric history on file.     No family history on file.  Social History   Tobacco Use   Smoking status: Never   Smokeless tobacco: Never  Vaping Use   Vaping Use: Never used  Substance Use Topics   Alcohol use: Never   Drug use: Never    Home Medications Prior to Admission medications   Medication Sig Start Date End Date Taking? Authorizing Provider  amLODipine (NORVASC) 10 MG tablet Take 1 tablet (10 mg total) by mouth daily. 11/21/20  Yes Palumbo, April, MD  naproxen (NAPROSYN) 500 MG tablet Take 1 tablet (500 mg total) by mouth 2 (two) times daily. 09/03/18   Maudie Flakes, MD    Allergies    Lisinopril  Review of Systems   Review of Systems  Eyes:  Positive for visual disturbance.  Respiratory:  Negative for shortness of breath.   Cardiovascular:  Negative for chest pain.   Gastrointestinal:  Negative for abdominal pain, diarrhea, nausea and vomiting.  Genitourinary:  Negative for dysuria.       Patient states that her urine has been darker.  Neurological:  Positive for headaches.  All other systems reviewed and are negative.  Physical Exam Updated Vital Signs BP (!) 180/101    Pulse 63    Temp 98 F (36.7 C) (Oral)    Resp (!) 21    Ht 5\' 4"  (1.626 m)    Wt 80.7 kg    SpO2 98%    BMI 30.54 kg/m   Physical Exam Vitals and nursing note reviewed.  Constitutional:      General: She is not in acute distress.    Appearance: She is not ill-appearing or toxic-appearing.  HENT:     Head: Normocephalic.     Nose: Nose normal.     Mouth/Throat:     Mouth: Mucous membranes are moist.  Eyes:     General: No visual field deficit.    Pupils: Pupils are equal, round, and reactive to light.  Cardiovascular:     Rate and Rhythm: Normal rate and regular rhythm.  Pulmonary:     Effort: Pulmonary effort is normal.     Breath sounds: Normal breath sounds. No wheezing.  Abdominal:     General: Abdomen is flat. Bowel sounds are normal.  Palpations: Abdomen is soft.     Tenderness: There is no abdominal tenderness.  Musculoskeletal:     Cervical back: Normal range of motion and neck supple.  Skin:    General: Skin is warm and dry.     Capillary Refill: Capillary refill takes less than 2 seconds.  Neurological:     General: No focal deficit present.     Mental Status: She is alert. Mental status is at baseline.     GCS: GCS eye subscore is 4. GCS verbal subscore is 5. GCS motor subscore is 6.     Cranial Nerves: Cranial nerves 2-12 are intact. No cranial nerve deficit.     Sensory: Sensation is intact.     Motor: Motor function is intact. No weakness.     Coordination: Coordination is intact.    ED Results / Procedures / Treatments   Labs (all labs ordered are listed, but only abnormal results are displayed) Labs Reviewed  BASIC METABOLIC PANEL -  Abnormal; Notable for the following components:      Result Value   Sodium 132 (*)    All other components within normal limits  URINALYSIS, ROUTINE W REFLEX MICROSCOPIC  CBC WITH DIFFERENTIAL/PLATELET    EKG EKG Interpretation  Date/Time:  Friday February 07 2021 11:46:15 EST Ventricular Rate:  64 PR Interval:  167 QRS Duration: 92 QT Interval:  406 QTC Calculation: 419 R Axis:   2 Text Interpretation: Sinus rhythm Borderline T wave abnormalities No significant change since last tracing Confirmed by Calvert Cantor [phone]) on 02/07/2021 11:58:47 AM  Radiology No results found.  Procedures Procedures   Medications Ordered in ED Medications - No data to display  ED Course  I have reviewed the triage vital signs and the nursing notes.  Pertinent labs & imaging results that were available during my care of the patient were reviewed by me and considered in my medical decision making (see chart for details).    MDM Rules/Calculators/A&P                          55 year old female with previously diagnosed hypertension here due to elevated blood pressure readings.  Patient endorsing slight blurred vision and slight headache.  I reviewed this patient's chart and she denies having a PCP but states that she has been taking her blood pressure medication every day.  The last time this patient had a blood pressure medication prescription filled was on 11/21/20 by Dr. Lorelle Formosa who provided 0 refills. I have a slight suspicion that this patient has not been taking her blood pressure medication every day.  On questioning, patient states that she has been taking this medication every day.  Patient work-up included CBC, urinalysis, BMP.  This work-up is largely unrevealing with no abnormalities.  I will refill this patient's prescription medication for amlodipine and advised her to establish care with a PCP for ongoing care and management of her hypertension.  I discussed this patient and  her case with Dr. Karle Starch who is in agreement with current plan.  I discussed the results of the patient work-up with the patient and she is in agreement with current outpatient follow-up plan.  I have provided the patient with discharge instructions and return precautions which she voices full understanding with.  Patient is stable on discharge. Final Clinical Impression(s) / ED Diagnoses Final diagnoses:  Hypertension, unspecified type    Rx / DC Orders ED Discharge Orders  None        Clent Ridges 02/07/21 1350    Pollyann Savoy, MD 02/07/21 1401

## 2021-02-07 NOTE — ED Notes (Signed)
D/c paperwork reviewed with pt, including prescription and f/u care. Pt verbalized understanding, no questions or concerns at time of d/c. Ambulatory to ED exit, NAD.  

## 2021-02-07 NOTE — ED Triage Notes (Signed)
Recent death in the family. Weakness and feeling bad since the death. She feels like her BP might be elevated. States her urine is dark.

## 2021-02-07 NOTE — Discharge Instructions (Addendum)
Return to ED with any new or worsening symptoms such as shortness of breath, chest pain You need to establish care with a primary care doctor.  I will refer you to 1 here today.  It is important for you to follow-up and make an appointment so that you can have ongoing management of your hypertension. Please pick up your prescription medication as we discussed.  Go ahead and take 1 dose today and follow directions as prescribed

## 2021-02-11 ENCOUNTER — Ambulatory Visit: Payer: Self-pay

## 2021-02-11 NOTE — Telephone Encounter (Signed)
°  Chief Complaint: high blood pressure Symptoms: high BP and pressure in eyes Frequency: several days Pertinent Negatives: NA Disposition: [] ED /[] Urgent Care (no appt availability in office) / [] Appointment(In office/virtual)/ []  [facility]/ [] Home Care/ [] Refused Recommended Disposition  Additional Notes: Pt doesn't have PCP and went to ED on 02/07/21. She has been taking her meds and feels better and her BP is coming down slowly but is asking mainly about a Dr's note for work from today. Advised her she would need to go back to ED to get a note or can call and speak to someone in the ED. Pt was transferred to [facility]. Pt was also advised , Agent would call back and set her up with [facility] for a [facility] location.    Reason for Disposition  Systolic BP  >= 180 OR Diastolic >= 110  Answer Assessment - Initial Assessment Questions 1. BLOOD PRESSURE: "What is the blood pressure?" "Did you take at least two measurements 5 minutes apart?"     171/128  2. ONSET: "When did you take your blood pressure?"     yesterday 3. HOW: "How did you obtain the blood pressure?" (e.g., visiting nurse, automatic home BP monitor)     NA 5. MEDICATIONS: "Are you taking any medications for blood pressure?" "Have you missed any doses recently?"     Picked up meds on Friday 6. OTHER SYMPTOMS: "Do you have any symptoms?" (e.g., headache, chest pain, blurred vision, difficulty breathing, weakness)    Pressure in eyes  Protocols used: Blood Pressure - High-A-AH

## 2021-02-19 ENCOUNTER — Inpatient Hospital Stay: Payer: Self-pay | Admitting: Physician Assistant

## 2021-03-05 ENCOUNTER — Ambulatory Visit: Payer: Self-pay | Attending: Family Medicine | Admitting: Family Medicine

## 2021-07-23 ENCOUNTER — Encounter (HOSPITAL_BASED_OUTPATIENT_CLINIC_OR_DEPARTMENT_OTHER): Payer: Self-pay

## 2021-07-23 ENCOUNTER — Emergency Department (HOSPITAL_BASED_OUTPATIENT_CLINIC_OR_DEPARTMENT_OTHER)
Admission: EM | Admit: 2021-07-23 | Discharge: 2021-07-23 | Disposition: A | Discharge status: Home / Self Care | Payer: Self-pay | Attending: Emergency Medicine | Admitting: Emergency Medicine

## 2021-07-23 ENCOUNTER — Other Ambulatory Visit (HOSPITAL_BASED_OUTPATIENT_CLINIC_OR_DEPARTMENT_OTHER): Payer: Self-pay

## 2021-07-23 ENCOUNTER — Other Ambulatory Visit: Payer: Self-pay

## 2021-07-23 DIAGNOSIS — K529 Noninfective gastroenteritis and colitis, unspecified: Secondary | ICD-10-CM | POA: Insufficient documentation

## 2021-07-23 DIAGNOSIS — E860 Dehydration: Secondary | ICD-10-CM | POA: Insufficient documentation

## 2021-07-23 DIAGNOSIS — Z79899 Other long term (current) drug therapy: Secondary | ICD-10-CM | POA: Insufficient documentation

## 2021-07-23 DIAGNOSIS — I10 Essential (primary) hypertension: Secondary | ICD-10-CM | POA: Insufficient documentation

## 2021-07-23 LAB — URINALYSIS, ROUTINE W REFLEX MICROSCOPIC
Bilirubin Urine: NEGATIVE
Glucose, UA: NEGATIVE mg/dL
Hgb urine dipstick: NEGATIVE
Ketones, ur: NEGATIVE mg/dL
Leukocytes,Ua: NEGATIVE
Nitrite: NEGATIVE
Protein, ur: NEGATIVE mg/dL
Specific Gravity, Urine: <=1.005 (ref 1.005–1.030)
pH: 5 (ref 5.0–8.0)

## 2021-07-23 LAB — CBC WITH DIFFERENTIAL/PLATELET
Abs Immature Granulocytes: 0.01 10*3/uL (ref 0.00–0.07)
Basophils Absolute: 0 10*3/uL (ref 0.0–0.1)
Basophils Relative: 1 %
Eosinophils Absolute: 0.1 10*3/uL (ref 0.0–0.5)
Eosinophils Relative: 2 %
HCT: 37.7 % (ref 36.0–46.0)
Hemoglobin: 13.4 g/dL (ref 12.0–15.0)
Immature Granulocytes: 0 %
Lymphocytes Relative: 36 %
Lymphs Abs: 1.5 10*3/uL (ref 0.7–4.0)
MCH: 33.1 pg (ref 26.0–34.0)
MCHC: 35.5 g/dL (ref 30.0–36.0)
MCV: 93.1 fL (ref 80.0–100.0)
Monocytes Absolute: 0.6 10*3/uL (ref 0.1–1.0)
Monocytes Relative: 15 %
Neutro Abs: 1.9 10*3/uL (ref 1.7–7.7)
Neutrophils Relative %: 46 %
Platelets: 219 10*3/uL (ref 150–400)
RBC: 4.05 MIL/uL (ref 3.87–5.11)
RDW: 13.2 % (ref 11.5–15.5)
WBC: 4.2 10*3/uL (ref 4.0–10.5)
nRBC: 0 % (ref 0.0–0.2)

## 2021-07-23 LAB — COMPREHENSIVE METABOLIC PANEL
ALT: 44 U/L (ref 0–44)
AST: 62 U/L — ABNORMAL HIGH (ref 15–41)
Albumin: 3.4 g/dL — ABNORMAL LOW (ref 3.5–5.0)
Alkaline Phosphatase: 83 U/L (ref 38–126)
Anion gap: 8 (ref 5–15)
BUN: 8 mg/dL (ref 6–20)
CO2: 21 mmol/L — ABNORMAL LOW (ref 22–32)
Calcium: 8.7 mg/dL — ABNORMAL LOW (ref 8.9–10.3)
Chloride: 101 mmol/L (ref 98–111)
Creatinine, Ser: 0.88 mg/dL (ref 0.44–1.00)
GFR, Estimated: >60 mL/min (ref >60)
Glucose, Bld: 108 mg/dL — ABNORMAL HIGH (ref 70–99)
Potassium: 3.7 mmol/L (ref 3.5–5.1)
Sodium: 130 mmol/L — ABNORMAL LOW (ref 135–145)
Total Bilirubin: 0.8 mg/dL (ref 0.3–1.2)
Total Protein: 7.6 g/dL (ref 6.5–8.1)

## 2021-07-23 LAB — LIPASE, BLOOD: Lipase: 25 U/L (ref 11–51)

## 2021-07-23 MED ORDER — SODIUM CHLORIDE 0.9 % IV BOLUS
1000.0000 mL | Freq: Once | INTRAVENOUS | Status: AC
  Administered 2021-07-23: 1000 mL via INTRAVENOUS

## 2021-07-23 MED ORDER — ONDANSETRON HCL 4 MG/2ML IJ SOLN
4.0000 mg | Freq: Once | INTRAMUSCULAR | Status: AC
  Administered 2021-07-23: 4 mg via INTRAVENOUS
  Filled 2021-07-23: qty 2

## 2021-07-23 MED ORDER — AMLODIPINE BESYLATE 10 MG PO TABS
10.0000 mg | ORAL_TABLET | Freq: Every day | ORAL | 0 refills | Status: DC
  Filled 2021-07-23: qty 30, 30d supply, fill #0

## 2021-07-23 MED ORDER — PROMETHAZINE HCL 25 MG PO TABS
25.0000 mg | ORAL_TABLET | Freq: Four times a day (QID) | ORAL | 0 refills | Status: AC | PRN
  Filled 2021-07-23: qty 10, 3d supply, fill #0

## 2021-07-23 NOTE — ED Provider Notes (Signed)
[facility] EMERGENCY DEPARTMENT Provider Note   CSN: [id number] Arrival date & time: 07/23/21  0539     History  Chief Complaint  Patient presents with   Abdominal Pain    Jade Mcclure is a 56 y.o. female.  Pt is a 56 yo female with a pmhx significant for htn.  Pt said she has been vomiting with diarrhea and abd pain for the past 24 hrs.  She was exposed to a co-worker who had the same complaints.  Pt also has elevated bp.  She could not afford her bp meds as her insurance for her new job has not yet kicked in.  Pt denies any fevers.      Home Medications Prior to Admission medications   Medication Sig Start Date End Date Taking? Authorizing Provider  promethazine (PHENERGAN) 25 MG tablet Take 1 tablet (25 mg total) by mouth every 6 (six) hours as needed for nausea or vomiting. 07/23/21  Yes Jacalyn Lefevre, MD  amLODipine (NORVASC) 10 MG tablet Take 1 tablet (10 mg total) by mouth daily. 07/23/21   Jacalyn Lefevre, MD  naproxen (NAPROSYN) 500 MG tablet Take 1 tablet (500 mg total) by mouth 2 (two) times daily. 09/03/18   Sabas Sous, MD      Allergies    Lisinopril    Review of Systems   Review of Systems  Gastrointestinal:  Positive for abdominal pain, diarrhea, nausea and vomiting.  All other systems reviewed and are negative.  Physical Exam Updated Vital Signs BP (!) 146/103   Pulse 87   Temp 97.9 F (36.6 C) (Oral)   Resp 16   Ht 5\' 2"  (1.575 m)   Wt 82.6 kg   SpO2 97%   BMI 33.29 kg/m  Physical Exam Vitals and nursing note reviewed.  Constitutional:      Appearance: She is well-developed.  HENT:     Head: Normocephalic and atraumatic.     Mouth/Throat:     Mouth: Mucous membranes are dry.  Eyes:     Extraocular Movements: Extraocular movements intact.     Pupils: Pupils are equal, round, and reactive to light.  Cardiovascular:     Rate and Rhythm: Normal rate and regular rhythm.  Pulmonary:     Effort: Pulmonary effort is normal.      Breath sounds: Normal breath sounds.  Abdominal:     General: Abdomen is flat. Bowel sounds are normal.     Palpations: Abdomen is soft.     Tenderness: There is generalized abdominal tenderness.  Skin:    General: Skin is warm.     Capillary Refill: Capillary refill takes less than 2 seconds.  Neurological:     General: No focal deficit present.     Mental Status: She is alert and oriented to person, place, and time.  Psychiatric:        Mood and Affect: Mood normal.        Behavior: Behavior normal.    ED Results / Procedures / Treatments   Labs (all labs ordered are listed, but only abnormal results are displayed) Labs Reviewed  COMPREHENSIVE METABOLIC PANEL - Abnormal; Notable for the following components:      Result Value   Sodium 130 (*)    CO2 21 (*)    Glucose, Bld 108 (*)    Calcium 8.7 (*)    Albumin 3.4 (*)    AST 62 (*)    All other components within normal limits  CBC WITH DIFFERENTIAL/PLATELET  LIPASE, BLOOD  URINALYSIS, ROUTINE W REFLEX MICROSCOPIC    EKG None  Radiology No results found.  Procedures Procedures    Medications Ordered in ED Medications  sodium chloride 0.9 % bolus 1,000 mL (0 mLs Intravenous Stopped 07/23/21 1001)  ondansetron (ZOFRAN) injection 4 mg (4 mg Intravenous Given 07/23/21 9767)    ED Course/ Medical Decision Making/ A&P                           Medical Decision Making Amount and/or Complexity of Data Reviewed Labs: ordered.  Risk Prescription drug management.   This patient presents to the ED for concern of abd pain, this involves an extensive number of treatment options, and is a complaint that carries with it a high risk of complications and morbidity.  The differential diagnosis includes gastroenteritis, uti, cholecystitis, appendicitis   Co morbidities that complicate the patient evaluation  htn   Additional history obtained:  Additional history obtained from epic chart review  Lab Tests:  I  Ordered, and personally interpreted labs.  The pertinent results include:  cbc nl, lipase nl, cmp nl, ua nl  Cardiac Monitoring:  The patient was maintained on a cardiac monitor.  I personally viewed and interpreted the cardiac monitored which showed an underlying rhythm of: nsr   Medicines ordered and prescription drug management:  I ordered medication including zofran and ivfs  for nausea and dehydration  Reevaluation of the patient after these medicines showed that the patient improved I have reviewed the patients home medicines and have made adjustments as needed   Test Considered:  Ct abd/pelvis; however, pt's labs are nl and she is afebrile.     Critical Interventions:  Ivfs and antiemetics   Problem List / ED Course:  N/V/D:  Likely a viral illness.  She is feeling better after fluids and meds.  She is able to tolerate po fluids.  Pt d/c with phenergan.  She is to return if worse.  She needs to establish care with a pcp. HTN:  Rx for Norvasc sent to our pharmacy.  I spoke to the pharmacist and they have a new $5 list and Norvasc is on the list.   Reevaluation:  After the interventions noted above, I reevaluated the patient and found that they have :improved   Social Determinants of Health:  No insurance   Dispostion:  After consideration of the diagnostic results and the patients response to treatment, I feel that the patent would benefit from discharge with outpatient f/u.          Final Clinical Impression(s) / ED Diagnoses Final diagnoses:  Gastroenteritis  Dehydration    Rx / DC Orders ED Discharge Orders          Ordered    amLODipine (NORVASC) 10 MG tablet  Daily        07/23/21 0848    promethazine (PHENERGAN) 25 MG tablet  Every 6 hours PRN        07/23/21 0936              Jacalyn Lefevre, MD 07/23/21 1010

## 2021-07-23 NOTE — ED Triage Notes (Signed)
C/o abdominal pain, vomiting/diarrhea since yesterday. States able to tolerate fluids/some food. Thinks she has a stomach bug that's going around her work.

## 2021-07-23 NOTE — ED Notes (Signed)
Pt states she hasnt taken her antihypertensives in over a week, she ran out. BP 172/102

## 2021-07-23 NOTE — ED Notes (Signed)
Pt reports feeling better, nausea improved, tolerating po fluids.  Ambulated to bathroom with steady gait

## 2021-07-23 NOTE — Discharge Instructions (Addendum)
You need to establish care with a PCP 

## 2021-07-31 ENCOUNTER — Encounter (HOSPITAL_BASED_OUTPATIENT_CLINIC_OR_DEPARTMENT_OTHER): Payer: Self-pay

## 2021-07-31 ENCOUNTER — Emergency Department (HOSPITAL_BASED_OUTPATIENT_CLINIC_OR_DEPARTMENT_OTHER)
Admission: EM | Admit: 2021-07-31 | Discharge: 2021-07-31 | Disposition: A | Discharge status: Home / Self Care | Payer: Self-pay | Attending: Emergency Medicine | Admitting: Emergency Medicine

## 2021-07-31 ENCOUNTER — Other Ambulatory Visit (HOSPITAL_BASED_OUTPATIENT_CLINIC_OR_DEPARTMENT_OTHER): Payer: Self-pay

## 2021-07-31 DIAGNOSIS — N3000 Acute cystitis without hematuria: Secondary | ICD-10-CM | POA: Insufficient documentation

## 2021-07-31 DIAGNOSIS — Z79899 Other long term (current) drug therapy: Secondary | ICD-10-CM | POA: Insufficient documentation

## 2021-07-31 DIAGNOSIS — R748 Abnormal levels of other serum enzymes: Secondary | ICD-10-CM | POA: Insufficient documentation

## 2021-07-31 DIAGNOSIS — K529 Noninfective gastroenteritis and colitis, unspecified: Secondary | ICD-10-CM | POA: Insufficient documentation

## 2021-07-31 DIAGNOSIS — R112 Nausea with vomiting, unspecified: Secondary | ICD-10-CM

## 2021-07-31 DIAGNOSIS — I10 Essential (primary) hypertension: Secondary | ICD-10-CM | POA: Insufficient documentation

## 2021-07-31 LAB — CBC
HCT: 40.3 % (ref 36.0–46.0)
Hemoglobin: 14 g/dL (ref 12.0–15.0)
MCH: 33.1 pg (ref 26.0–34.0)
MCHC: 34.7 g/dL (ref 30.0–36.0)
MCV: 95.3 fL (ref 80.0–100.0)
Platelets: 180 10*3/uL (ref 150–400)
RBC: 4.23 MIL/uL (ref 3.87–5.11)
RDW: 13.1 % (ref 11.5–15.5)
WBC: 4.8 10*3/uL (ref 4.0–10.5)
nRBC: 0 % (ref 0.0–0.2)

## 2021-07-31 LAB — URINALYSIS, MICROSCOPIC (REFLEX)

## 2021-07-31 LAB — COMPREHENSIVE METABOLIC PANEL
ALT: 55 U/L — ABNORMAL HIGH (ref 0–44)
AST: 89 U/L — ABNORMAL HIGH (ref 15–41)
Albumin: 3.7 g/dL (ref 3.5–5.0)
Alkaline Phosphatase: 86 U/L (ref 38–126)
Anion gap: 5 (ref 5–15)
BUN: 8 mg/dL (ref 6–20)
CO2: 28 mmol/L (ref 22–32)
Calcium: 9.1 mg/dL (ref 8.9–10.3)
Chloride: 101 mmol/L (ref 98–111)
Creatinine, Ser: 0.97 mg/dL (ref 0.44–1.00)
GFR, Estimated: >60 mL/min (ref >60)
Glucose, Bld: 87 mg/dL (ref 70–99)
Potassium: 3.8 mmol/L (ref 3.5–5.1)
Sodium: 134 mmol/L — ABNORMAL LOW (ref 135–145)
Total Bilirubin: 1.4 mg/dL — ABNORMAL HIGH (ref 0.3–1.2)
Total Protein: 8.3 g/dL — ABNORMAL HIGH (ref 6.5–8.1)

## 2021-07-31 LAB — URINALYSIS, ROUTINE W REFLEX MICROSCOPIC
Glucose, UA: NEGATIVE mg/dL
Hgb urine dipstick: NEGATIVE
Ketones, ur: NEGATIVE mg/dL
Leukocytes,Ua: NEGATIVE
Nitrite: POSITIVE — AB
Protein, ur: NEGATIVE mg/dL
Specific Gravity, Urine: >=1.03 (ref 1.005–1.030)
pH: 5.5 (ref 5.0–8.0)

## 2021-07-31 LAB — LIPASE, BLOOD: Lipase: 27 U/L (ref 11–51)

## 2021-07-31 LAB — PREGNANCY, URINE: Preg Test, Ur: NEGATIVE

## 2021-07-31 MED ORDER — SODIUM CHLORIDE 0.9 % IV BOLUS
1000.0000 mL | Freq: Once | INTRAVENOUS | Status: AC
  Administered 2021-07-31: 1000 mL via INTRAVENOUS

## 2021-07-31 MED ORDER — SULFAMETHOXAZOLE-TRIMETHOPRIM 800-160 MG PO TABS: 1.0000 | ORAL_TABLET | Freq: Two times a day (BID) | ORAL | 0 refills | Status: AC

## 2021-07-31 MED ORDER — ONDANSETRON HCL 4 MG/2ML IJ SOLN
4.0000 mg | Freq: Once | INTRAMUSCULAR | Status: AC
Start: 2021-07-31 — End: 2021-07-31
  Administered 2021-07-31: 4 mg via INTRAVENOUS
  Filled 2021-07-31: qty 2

## 2021-07-31 NOTE — ED Triage Notes (Signed)
C/o generalized abdominal pain and vomiting x 3 days. Denies diarrhea.

## 2021-07-31 NOTE — ED Provider Notes (Signed)
[facility] EMERGENCY DEPARTMENT Provider Note   CSN: [id number] Arrival date & time: 07/31/21  8299     History  Chief Complaint  Patient presents with   Abdominal Pain    Jade Mcclure is a 56 y.o. female with history of HTN who presents to the emergency department for abdominal pain and vomiting x 3 days. Was seen several days ago for similar symptoms.  States that she thinks multiple people at her workplace have a GI virus.  After she was seen previously her symptoms had improved, but returned 3 days ago.  She denies any fever, states that she has been intermittently vomiting.  Every time she vomits her abdominal pain improves a little bit.  She is tolerating p.o.  She has been taking the Phenergan that was prescribed to her last week with improvement.  No other symptoms noted.  When she was seen the other day she was also prescribed Norvasc for her blood pressure.  States that she has been taking it daily, but did not take it today as she was feeling too nauseous.   Abdominal Pain Associated symptoms: nausea and vomiting   Associated symptoms: no diarrhea, no dysuria, no fever and no hematuria        Home Medications Prior to Admission medications   Medication Sig Start Date End Date Taking? Authorizing Provider  sulfamethoxazole-trimethoprim (BACTRIM DS) 800-160 MG tablet Take 1 tablet by mouth 2 (two) times daily for 3 days. 07/31/21 08/03/21 Yes Jamoni Hewes T, PA-C  amLODipine (NORVASC) 10 MG tablet Take 1 tablet (10 mg total) by mouth daily. 07/23/21   Jacalyn Lefevre, MD  naproxen (NAPROSYN) 500 MG tablet Take 1 tablet (500 mg total) by mouth 2 (two) times daily. 09/03/18   Sabas Sous, MD  promethazine (PHENERGAN) 25 MG tablet Take 1 tablet (25 mg total) by mouth every 6 (six) hours as needed for nausea or vomiting. 07/23/21   Jacalyn Lefevre, MD      Allergies    Lisinopril    Review of Systems   Review of Systems  Constitutional:  Negative for fever.   Gastrointestinal:  Positive for abdominal pain, nausea and vomiting. Negative for diarrhea.  Genitourinary:  Negative for dysuria, frequency, hematuria and urgency.  All other systems reviewed and are negative.   Physical Exam Updated Vital Signs BP (!) 170/114   Pulse 77   Temp 98.6 F (37 C) (Oral)   Resp 20   Ht 5\' 2"  (1.575 m)   Wt 77.2 kg   SpO2 100%   BMI 31.13 kg/m  Physical Exam Vitals and nursing note reviewed.  Constitutional:      Appearance: Normal appearance. She is not ill-appearing or toxic-appearing.  HENT:     Head: Normocephalic and atraumatic.  Eyes:     Conjunctiva/sclera: Conjunctivae normal.  Cardiovascular:     Rate and Rhythm: Normal rate and regular rhythm.  Pulmonary:     Effort: Pulmonary effort is normal. No respiratory distress.     Breath sounds: Normal breath sounds.  Abdominal:     General: There is no distension.     Palpations: Abdomen is soft.     Tenderness: There is no abdominal tenderness. There is no guarding or rebound.  Skin:    General: Skin is warm and dry.  Neurological:     General: No focal deficit present.     Mental Status: She is alert.     ED Results / Procedures / Treatments   Labs (  all labs ordered are listed, but only abnormal results are displayed) Labs Reviewed  COMPREHENSIVE METABOLIC PANEL - Abnormal; Notable for the following components:      Result Value   Sodium 134 (*)    Total Protein 8.3 (*)    AST 89 (*)    ALT 55 (*)    Total Bilirubin 1.4 (*)    All other components within normal limits  URINALYSIS, ROUTINE W REFLEX MICROSCOPIC - Abnormal; Notable for the following components:   Bilirubin Urine SMALL (*)    Nitrite POSITIVE (*)    All other components within normal limits  URINALYSIS, MICROSCOPIC (REFLEX) - Abnormal; Notable for the following components:   Bacteria, UA FEW (*)    All other components within normal limits  LIPASE, BLOOD  CBC  PREGNANCY, URINE     EKG None  Radiology No results found.  Procedures Procedures    Medications Ordered in ED Medications  sodium chloride 0.9 % bolus 1,000 mL (0 mLs Intravenous Stopped 07/31/21 1115)  ondansetron (ZOFRAN) injection 4 mg (4 mg Intravenous Given 07/31/21 1013)    ED Course/ Medical Decision Making/ A&P                           Medical Decision Making Amount and/or Complexity of Data Reviewed Labs: ordered.  This patient is a 56 y.o. female  who presents to the ED for concern of abdominal pain and vomiting x 3 days.   Differential diagnoses prior to evaluation: The emergent differential diagnosis includes, but is not limited to,  AAA, mesenteric ischemia, appendicitis, diverticulitis, DKA, gastritis/gastroenteritis, nephrolithiasis, pancreatitis, constipation, UTI, SBO, biliary disease, IBD, PUD, hepatitis. This is not an exhaustive differential.   Past Medical History / Co-morbidities: Hypertension  Additional history: Chart reviewed. Pertinent results include: Patient was seen at this facility for similar symptoms on 6/7, was diagnosed with likely viral gastroenteritis and discharged with prescriptions for phenergran and norvasc for her hypertension.   Physical Exam: Physical exam performed. The pertinent findings include: Is clinically well-appearing.  Hypertensive, but consistent with not taking BP meds today.  Vital signs normal otherwise.  Abdomen soft, with no tenderness.  Is clinically well-appearing.  Lab Tests/Imaging studies: I personally interpreted labs/imaging and the pertinent results include: Elevated liver enzymes, history of such.  No leukocytosis, normal hemoglobin.  Urinalysis negative for hematuria, positive nitrate consistent with UTI.    Medications: I ordered medication including IV fluids and Zofran.  I have reviewed the patients home medicines and have made adjustments as needed.   Disposition: After consideration of the diagnostic results and  the patients response to treatment, I feel that patient's not requiring admission.  Symptoms consistent with viral gastroenteritis.  I am reassured that she is clinically well-appearing, with a nonsurgical abdominal exam.  Labs overall reassuring, with urine positive for UTI.  Will discharge with antibiotics.  Discussed reasons to return to the emergency department, and the patient is agreeable to the plan.   Final Clinical Impression(s) / ED Diagnoses Final diagnoses:  Gastroenteritis  Nausea and vomiting, unspecified vomiting type  Acute cystitis without hematuria    Rx / DC Orders ED Discharge Orders          Ordered    sulfamethoxazole-trimethoprim (BACTRIM DS) 800-160 MG tablet  2 times daily        07/31/21 1146           Portions of this report may have been transcribed using  voice recognition software. Every effort was made to ensure accuracy; however, inadvertent computerized transcription errors may be present.    Jeanella Flattery 07/31/21 1147    Derwood Kaplan, MD 08/01/21 [id number]

## 2021-07-31 NOTE — ED Notes (Signed)
Given ginger ale 

## 2021-07-31 NOTE — ED Notes (Signed)
ED Provider at bedside. 

## 2021-07-31 NOTE — Discharge Instructions (Addendum)
You were seen in the emergency department today for abdominal pain and vomiting.  As we discussed I agree I think your symptoms are related to a viral GI infection.  This is consistent with other people at work being sick 2.  Your lab work looked reassuring.  I am glad that you are able to eat and drink after some fluids and nausea medicine.   You also have what looks like a urinary tract infection.  We normally treat this with antibiotics.  I sent a prescription to your pharmacy, have also printed a coupon for you.  Continue to monitor how you're doing and return to the ER for new or worsening symptoms.

## 2021-08-01 ENCOUNTER — Other Ambulatory Visit (HOSPITAL_BASED_OUTPATIENT_CLINIC_OR_DEPARTMENT_OTHER): Payer: Self-pay

## 2023-11-29 ENCOUNTER — Other Ambulatory Visit: Payer: Self-pay

## 2023-11-29 ENCOUNTER — Emergency Department (HOSPITAL_BASED_OUTPATIENT_CLINIC_OR_DEPARTMENT_OTHER)
Admission: EM | Admit: 2023-11-29 | Discharge: 2023-11-29 | Disposition: A | Discharge status: Home / Self Care | Payer: Self-pay | Attending: Emergency Medicine | Admitting: Emergency Medicine

## 2023-11-29 ENCOUNTER — Emergency Department (HOSPITAL_BASED_OUTPATIENT_CLINIC_OR_DEPARTMENT_OTHER): Payer: Self-pay

## 2023-11-29 ENCOUNTER — Other Ambulatory Visit (HOSPITAL_BASED_OUTPATIENT_CLINIC_OR_DEPARTMENT_OTHER): Payer: Self-pay

## 2023-11-29 ENCOUNTER — Encounter (HOSPITAL_BASED_OUTPATIENT_CLINIC_OR_DEPARTMENT_OTHER): Payer: Self-pay | Admitting: Emergency Medicine

## 2023-11-29 DIAGNOSIS — T25132A Burn of first degree of left toe(s) (nail), initial encounter: Secondary | ICD-10-CM | POA: Insufficient documentation

## 2023-11-29 DIAGNOSIS — S46911A Strain of unspecified muscle, fascia and tendon at shoulder and upper arm level, right arm, initial encounter: Secondary | ICD-10-CM | POA: Insufficient documentation

## 2023-11-29 DIAGNOSIS — X118XXA Contact with other hot tap-water, initial encounter: Secondary | ICD-10-CM | POA: Insufficient documentation

## 2023-11-29 DIAGNOSIS — T24211A Burn of second degree of right thigh, initial encounter: Secondary | ICD-10-CM | POA: Insufficient documentation

## 2023-11-29 DIAGNOSIS — S63615A Unspecified sprain of left ring finger, initial encounter: Secondary | ICD-10-CM | POA: Insufficient documentation

## 2023-11-29 DIAGNOSIS — M19011 Primary osteoarthritis, right shoulder: Secondary | ICD-10-CM | POA: Insufficient documentation

## 2023-11-29 DIAGNOSIS — I10 Essential (primary) hypertension: Secondary | ICD-10-CM | POA: Insufficient documentation

## 2023-11-29 DIAGNOSIS — W19XXXA Unspecified fall, initial encounter: Secondary | ICD-10-CM | POA: Insufficient documentation

## 2023-11-29 DIAGNOSIS — Z79899 Other long term (current) drug therapy: Secondary | ICD-10-CM | POA: Insufficient documentation

## 2023-11-29 DIAGNOSIS — Z23 Encounter for immunization: Secondary | ICD-10-CM | POA: Insufficient documentation

## 2023-11-29 MED ORDER — DICLOFENAC SODIUM 1 % EX GEL
2.0000 g | Freq: Four times a day (QID) | CUTANEOUS | 0 refills | Status: AC
  Filled 2023-11-29: qty 100, 7d supply, fill #0

## 2023-11-29 MED ORDER — AMLODIPINE BESYLATE 10 MG PO TABS
10.0000 mg | ORAL_TABLET | Freq: Every day | ORAL | 0 refills | Status: AC
  Filled 2023-11-29: qty 30, 30d supply, fill #0

## 2023-11-29 MED ORDER — BACITRACIN ZINC 500 UNIT/GM EX OINT
TOPICAL_OINTMENT | Freq: Two times a day (BID) | CUTANEOUS | Status: DC
  Administered 2023-11-29: 31.5 via TOPICAL
  Filled 2023-11-29: qty 28.35

## 2023-11-29 MED ORDER — TETANUS-DIPHTH-ACELL PERTUSSIS 5-2-15.5 LF-MCG/0.5 IM SUSP
0.5000 mL | Freq: Once | INTRAMUSCULAR | Status: AC
  Administered 2023-11-29: 0.5 mL via INTRAMUSCULAR
  Filled 2023-11-29: qty 0.5

## 2023-11-29 MED ORDER — AMLODIPINE BESYLATE 5 MG PO TABS
10.0000 mg | ORAL_TABLET | Freq: Once | ORAL | Status: AC
  Administered 2023-11-29: 10 mg via ORAL
  Filled 2023-11-29: qty 2
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
## 2023-11-29 NOTE — ED Notes (Signed)

## 2023-11-29 NOTE — ED Notes (Signed)
 Pt has returned to their room
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
## 2023-11-29 NOTE — ED Notes (Signed)
 Patient transported to X-ray
